# Patient Record
Sex: Female | Born: 1935 | Race: White | Hispanic: No | Marital: Married
Health system: Southern US, Community
[De-identification: ages and names within clinical notes are randomized; demographics above are authoritative.]

---

## 2015-05-28 ENCOUNTER — Inpatient Hospital Stay
Admission: RE | Admit: 2015-05-28 | Discharge: 2015-06-19 | Disposition: A | Discharge status: Home Health | Payer: Medicare Other | Source: Ambulatory Visit | Attending: Internal Medicine | Admitting: Internal Medicine

## 2015-05-28 DIAGNOSIS — L0291 Cutaneous abscess, unspecified: Secondary | ICD-10-CM

## 2015-05-28 DIAGNOSIS — K750 Abscess of liver: Secondary | ICD-10-CM | POA: Insufficient documentation

## 2015-05-28 LAB — VANCOMYCIN, TROUGH: VANCOMYCIN TR: 17 ug/mL (ref 10.0–20.0)

## 2015-05-29 LAB — TSH: TSH: 9.419 u[IU]/mL — ABNORMAL HIGH (ref 0.350–4.500)

## 2015-05-29 LAB — VITAMIN B12: Vitamin B-12: 648 pg/mL (ref 180–914)

## 2015-05-29 LAB — COMPREHENSIVE METABOLIC PANEL
ALK PHOS: 255 U/L — AB (ref 38–126)
ALT: 14 U/L (ref 14–54)
ANION GAP: 6 (ref 5–15)
AST: 23 U/L (ref 15–41)
Albumin: 2.2 g/dL — ABNORMAL LOW (ref 3.5–5.0)
BILIRUBIN TOTAL: 0.3 mg/dL (ref 0.3–1.2)
BUN: 19 mg/dL (ref 6–20)
CALCIUM: 8.8 mg/dL — AB (ref 8.9–10.3)
CO2: 23 mmol/L (ref 22–32)
Chloride: 108 mmol/L (ref 101–111)
Creatinine, Ser: 0.76 mg/dL (ref 0.44–1.00)
Glucose, Bld: 96 mg/dL (ref 65–99)
Potassium: 4.2 mmol/L (ref 3.5–5.1)
SODIUM: 137 mmol/L (ref 135–145)
TOTAL PROTEIN: 6.5 g/dL (ref 6.5–8.1)

## 2015-05-29 LAB — CBC WITH DIFFERENTIAL/PLATELET
Basophils Absolute: 0.1 10*3/uL (ref 0.0–0.1)
Basophils Relative: 1 %
EOS ABS: 0.4 10*3/uL (ref 0.0–0.7)
Eosinophils Relative: 5 %
HEMATOCRIT: 29.4 % — AB (ref 36.0–46.0)
HEMOGLOBIN: 9.3 g/dL — AB (ref 12.0–15.0)
LYMPHS ABS: 1.6 10*3/uL (ref 0.7–4.0)
Lymphocytes Relative: 23 %
MCH: 28.1 pg (ref 26.0–34.0)
MCHC: 31.6 g/dL (ref 30.0–36.0)
MCV: 88.8 fL (ref 78.0–100.0)
MONOS PCT: 9 %
Monocytes Absolute: 0.7 10*3/uL (ref 0.1–1.0)
NEUTROS PCT: 61 %
Neutro Abs: 4.4 10*3/uL (ref 1.7–7.7)
Platelets: 282 10*3/uL (ref 150–400)
RBC: 3.31 MIL/uL — ABNORMAL LOW (ref 3.87–5.11)
RDW: 18.9 % — ABNORMAL HIGH (ref 11.5–15.5)
WBC: 7.1 10*3/uL (ref 4.0–10.5)

## 2015-05-29 LAB — PHOSPHORUS: PHOSPHORUS: 4.2 mg/dL (ref 2.5–4.6)

## 2015-05-29 LAB — MAGNESIUM: MAGNESIUM: 2.2 mg/dL (ref 1.7–2.4)

## 2015-05-29 LAB — T4, FREE: FREE T4: 1.09 ng/dL (ref 0.61–1.12)

## 2015-05-30 LAB — PHENYTOIN LEVEL, TOTAL

## 2015-05-30 LAB — FOLATE RBC
FOLATE, HEMOLYSATE: 553.3 ng/mL
Folate, RBC: 1948 ng/mL (ref >498)
HEMATOCRIT: 28.4 % — AB (ref 34.0–46.6)

## 2015-05-30 LAB — VANCOMYCIN, TROUGH: VANCOMYCIN TR: 17 ug/mL (ref 10.0–20.0)

## 2015-06-02 DIAGNOSIS — K750 Abscess of liver: Secondary | ICD-10-CM | POA: Insufficient documentation

## 2015-06-02 NOTE — Progress Notes (Signed)
Patient ID: Valerie Osborn, female   DOB: 07/01/1932, 81 y.o.   MRN: [id number]          Subjective: Pt eating lunch; without new c/o   Allergies: Review of patient's allergies indicates not on file.  Medications: Prior to Admission medications   Not on File     Vital Signs: VSS;AF   Physical Exam awake/alert; left abd/hepatic drain intact, insertion site ok,NT; output minimal; abd soft,ND; drain irrigated with sterile NS with minimal return  Imaging: No results found.  Labs:  CBC:  Recent Labs  05/29/15 0610  WBC 7.1  HGB 9.3*  HCT 29.4*  28.4*  PLT 282    COAGS: No results for input(s): INR, APTT in the last 8760 hours.  BMP:  Recent Labs  05/29/15 0610  NA 137  K 4.2  CL 108  CO2 23  GLUCOSE 96  BUN 19  CALCIUM 8.8*  CREATININE 0.76  GFRNONAA >60  GFRAA >60    LIVER FUNCTION TESTS:  Recent Labs  05/29/15 0610  BILITOT 0.3  AST 23  ALT 14  ALKPHOS 255*  PROT 6.5  ALBUMIN 2.2*    Assessment and Plan: [facility] output minimal; [facility]; rec f/u CT next week to assess adequacy of drainage   Signed: D. Jeananne RamaKevin Allred 06/02/2015, 1:20 PM   I spent a total of 15 minutes at the the patient's bedside AND on the patient's hospital floor or unit, greater than 50% of which was counseling/coordinating care for hepatic abscess drain

## 2015-06-03 LAB — COMPREHENSIVE METABOLIC PANEL
ALBUMIN: 2.3 g/dL — AB (ref 3.5–5.0)
ALT: 32 U/L (ref 14–54)
ANION GAP: 5 (ref 5–15)
AST: 44 U/L — ABNORMAL HIGH (ref 15–41)
Alkaline Phosphatase: 382 U/L — ABNORMAL HIGH (ref 38–126)
BUN: 15 mg/dL (ref 6–20)
CHLORIDE: 103 mmol/L (ref 101–111)
CO2: 29 mmol/L (ref 22–32)
Calcium: 8.9 mg/dL (ref 8.9–10.3)
Creatinine, Ser: 0.68 mg/dL (ref 0.44–1.00)
GFR calc Af Amer: >60 mL/min (ref >60)
GFR calc non Af Amer: >60 mL/min (ref >60)
GLUCOSE: 94 mg/dL (ref 65–99)
POTASSIUM: 4.6 mmol/L (ref 3.5–5.1)
SODIUM: 137 mmol/L (ref 135–145)
TOTAL PROTEIN: 6.7 g/dL (ref 6.5–8.1)
Total Bilirubin: 0.3 mg/dL (ref 0.3–1.2)

## 2015-06-03 LAB — CBC
HCT: 28.1 % — ABNORMAL LOW (ref 36.0–46.0)
Hemoglobin: 9 g/dL — ABNORMAL LOW (ref 12.0–15.0)
MCH: 29.2 pg (ref 26.0–34.0)
MCHC: 32 g/dL (ref 30.0–36.0)
MCV: 91.2 fL (ref 78.0–100.0)
PLATELETS: 215 10*3/uL (ref 150–400)
RBC: 3.08 MIL/uL — ABNORMAL LOW (ref 3.87–5.11)
RDW: 18.3 % — AB (ref 11.5–15.5)
WBC: 4.6 10*3/uL (ref 4.0–10.5)

## 2015-06-06 LAB — CBC WITH DIFFERENTIAL/PLATELET
Basophils Absolute: 0.1 10*3/uL (ref 0.0–0.1)
Basophils Relative: 1 %
Eosinophils Absolute: 0.3 10*3/uL (ref 0.0–0.7)
Eosinophils Relative: 4 %
HCT: 31.1 % — ABNORMAL LOW (ref 36.0–46.0)
HEMOGLOBIN: 9.6 g/dL — AB (ref 12.0–15.0)
LYMPHS ABS: 1.7 10*3/uL (ref 0.7–4.0)
LYMPHS PCT: 27 %
MCH: 28.4 pg (ref 26.0–34.0)
MCHC: 30.9 g/dL (ref 30.0–36.0)
MCV: 92 fL (ref 78.0–100.0)
MONOS PCT: 8 %
Monocytes Absolute: 0.5 10*3/uL (ref 0.1–1.0)
NEUTROS ABS: 3.8 10*3/uL (ref 1.7–7.7)
NEUTROS PCT: 60 %
PLATELETS: 214 10*3/uL (ref 150–400)
RBC: 3.38 MIL/uL — ABNORMAL LOW (ref 3.87–5.11)
RDW: 17.7 % — ABNORMAL HIGH (ref 11.5–15.5)
WBC: 6.4 10*3/uL (ref 4.0–10.5)

## 2015-06-06 LAB — VANCOMYCIN, TROUGH: VANCOMYCIN TR: 26 ug/mL — AB (ref 10.0–20.0)

## 2015-06-06 LAB — BASIC METABOLIC PANEL
Anion gap: 6 (ref 5–15)
BUN: 18 mg/dL (ref 6–20)
CHLORIDE: 102 mmol/L (ref 101–111)
CO2: 30 mmol/L (ref 22–32)
CREATININE: 0.61 mg/dL (ref 0.44–1.00)
Calcium: 9.3 mg/dL (ref 8.9–10.3)
Glucose, Bld: 86 mg/dL (ref 65–99)
POTASSIUM: 5.2 mmol/L — AB (ref 3.5–5.1)
SODIUM: 138 mmol/L (ref 135–145)

## 2015-06-07 ENCOUNTER — Other Ambulatory Visit (HOSPITAL_COMMUNITY): Payer: Medicare Other

## 2015-06-07 LAB — BASIC METABOLIC PANEL
ANION GAP: 4 — AB (ref 5–15)
BUN: 19 mg/dL (ref 6–20)
CALCIUM: 8.6 mg/dL — AB (ref 8.9–10.3)
CO2: 30 mmol/L (ref 22–32)
CREATININE: 0.55 mg/dL (ref 0.44–1.00)
Chloride: 103 mmol/L (ref 101–111)
Glucose, Bld: 80 mg/dL (ref 65–99)
Potassium: 4.3 mmol/L (ref 3.5–5.1)
SODIUM: 137 mmol/L (ref 135–145)

## 2015-06-07 LAB — VANCOMYCIN, TROUGH: VANCOMYCIN TR: 12 ug/mL (ref 10.0–20.0)

## 2015-06-11 LAB — BASIC METABOLIC PANEL
ANION GAP: 8 (ref 5–15)
BUN: 22 mg/dL — ABNORMAL HIGH (ref 6–20)
CO2: 27 mmol/L (ref 22–32)
Calcium: 8.9 mg/dL (ref 8.9–10.3)
Chloride: 103 mmol/L (ref 101–111)
Creatinine, Ser: 0.52 mg/dL (ref 0.44–1.00)
GFR calc Af Amer: >60 mL/min (ref >60)
GLUCOSE: 80 mg/dL (ref 65–99)
POTASSIUM: 4.5 mmol/L (ref 3.5–5.1)
Sodium: 138 mmol/L (ref 135–145)

## 2015-06-11 LAB — CBC WITH DIFFERENTIAL/PLATELET
BASOS ABS: 0 10*3/uL (ref 0.0–0.1)
Basophils Relative: 1 %
Eosinophils Absolute: 0.3 10*3/uL (ref 0.0–0.7)
Eosinophils Relative: 5 %
HEMATOCRIT: 28.6 % — AB (ref 36.0–46.0)
Hemoglobin: 9 g/dL — ABNORMAL LOW (ref 12.0–15.0)
LYMPHS PCT: 41 %
Lymphs Abs: 1.9 10*3/uL (ref 0.7–4.0)
MCH: 28.6 pg (ref 26.0–34.0)
MCHC: 31.5 g/dL (ref 30.0–36.0)
MCV: 90.8 fL (ref 78.0–100.0)
Monocytes Absolute: 0.4 10*3/uL (ref 0.1–1.0)
Monocytes Relative: 8 %
NEUTROS ABS: 2.1 10*3/uL (ref 1.7–7.7)
Neutrophils Relative %: 45 %
PLATELETS: 211 10*3/uL (ref 150–400)
RBC: 3.15 MIL/uL — AB (ref 3.87–5.11)
RDW: 17 % — ABNORMAL HIGH (ref 11.5–15.5)
WBC: 4.7 10*3/uL (ref 4.0–10.5)

## 2015-06-12 ENCOUNTER — Other Ambulatory Visit (HOSPITAL_COMMUNITY): Payer: Medicare Other

## 2015-06-13 LAB — VANCOMYCIN, TROUGH: VANCOMYCIN TR: 12 ug/mL (ref 10.0–20.0)

## 2015-06-14 LAB — VANCOMYCIN, TROUGH: Vancomycin Tr: 22 ug/mL — ABNORMAL HIGH (ref 10.0–20.0)

## 2015-06-15 LAB — BASIC METABOLIC PANEL
ANION GAP: 5 (ref 5–15)
BUN: 19 mg/dL (ref 6–20)
CO2: 30 mmol/L (ref 22–32)
Calcium: 9 mg/dL (ref 8.9–10.3)
Chloride: 104 mmol/L (ref 101–111)
Creatinine, Ser: 0.58 mg/dL (ref 0.44–1.00)
GFR calc Af Amer: >60 mL/min (ref >60)
GFR calc non Af Amer: >60 mL/min (ref >60)
GLUCOSE: 81 mg/dL (ref 65–99)
POTASSIUM: 4.6 mmol/L (ref 3.5–5.1)
Sodium: 139 mmol/L (ref 135–145)

## 2015-06-15 LAB — CBC WITH DIFFERENTIAL/PLATELET
BASOS ABS: 0.1 10*3/uL (ref 0.0–0.1)
Basophils Relative: 1 %
Eosinophils Absolute: 0.2 10*3/uL (ref 0.0–0.7)
Eosinophils Relative: 3 %
HEMATOCRIT: 30.8 % — AB (ref 36.0–46.0)
HEMOGLOBIN: 9.9 g/dL — AB (ref 12.0–15.0)
LYMPHS PCT: 34 %
Lymphs Abs: 1.7 10*3/uL (ref 0.7–4.0)
MCH: 29.7 pg (ref 26.0–34.0)
MCHC: 32.1 g/dL (ref 30.0–36.0)
MCV: 92.5 fL (ref 78.0–100.0)
Monocytes Absolute: 0.5 10*3/uL (ref 0.1–1.0)
Monocytes Relative: 10 %
NEUTROS ABS: 2.6 10*3/uL (ref 1.7–7.7)
NEUTROS PCT: 52 %
Platelets: 261 10*3/uL (ref 150–400)
RBC: 3.33 MIL/uL — AB (ref 3.87–5.11)
RDW: 16.7 % — ABNORMAL HIGH (ref 11.5–15.5)
WBC: 5.1 10*3/uL (ref 4.0–10.5)

## 2015-06-15 LAB — VANCOMYCIN, TROUGH: Vancomycin Tr: 18 ug/mL (ref 10.0–20.0)

## 2015-06-18 ENCOUNTER — Other Ambulatory Visit (HOSPITAL_COMMUNITY): Payer: Medicare Other

## 2017-10-10 IMAGING — CT CT ABDOMEN W/O CM
2 of 4 series · 14 of 46 positions shown, 16 images · non-contrast
Comparison: 06/07/2015, 05/23/2015 and 05/05/2015.

CLINICAL DATA: Patient has had abscess drainage. Evaluate abscess
and catheter placement.

EXAM:
CT ABDOMEN WITHOUT CONTRAST
TECHNIQUE: Multidetector CT imaging of the abdomen was performed following the
standard protocol without IV contrast.

[Series 201: routine, idose (2) · axial · 0.69mm/px · z∈[+177,+392]mm · 11 of 49 slices shown, 13 images]
[im 3/49  soft-tissue]
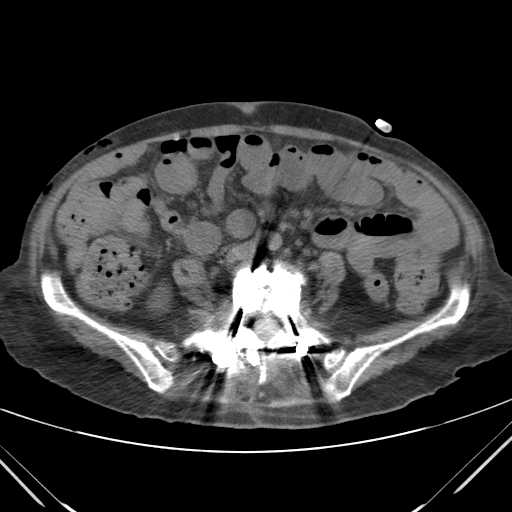
[im 3/49  bone]
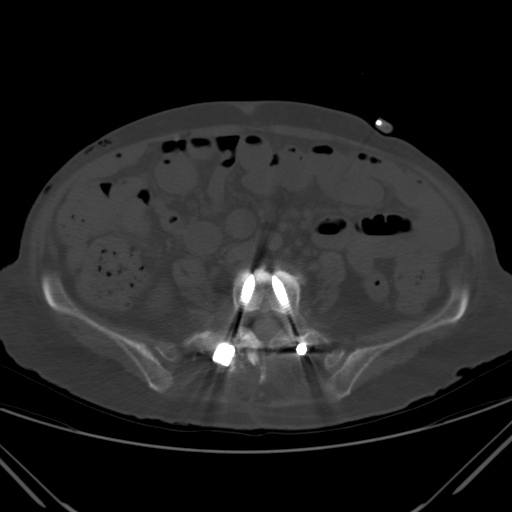
[im 9/49  soft-tissue]
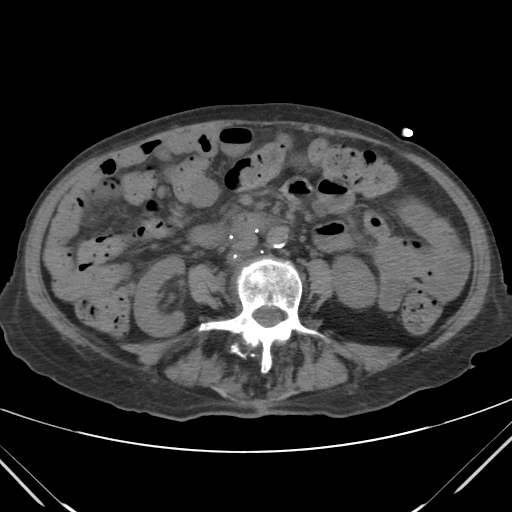
[im 11/49  soft-tissue]
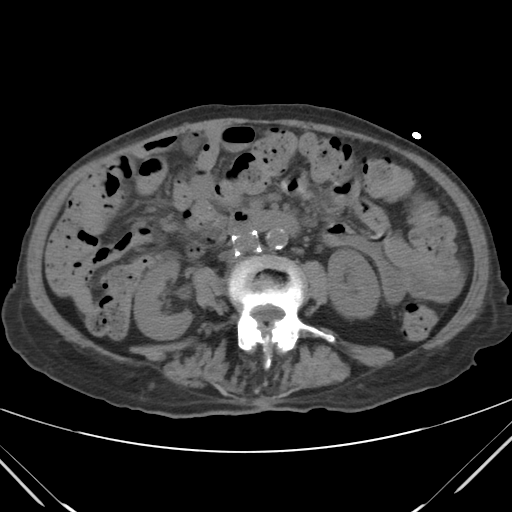
[im 17/49  soft-tissue]
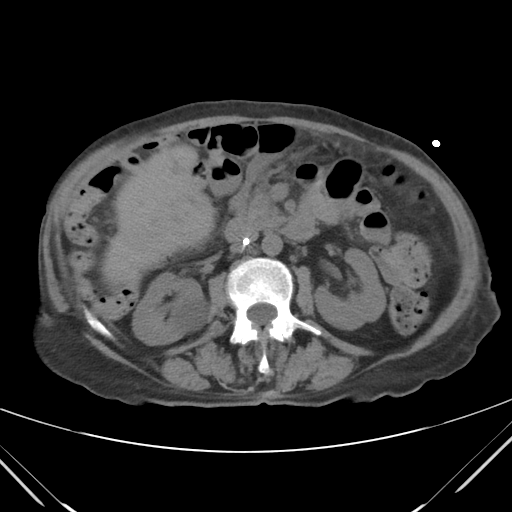
[im 19/49  soft-tissue]
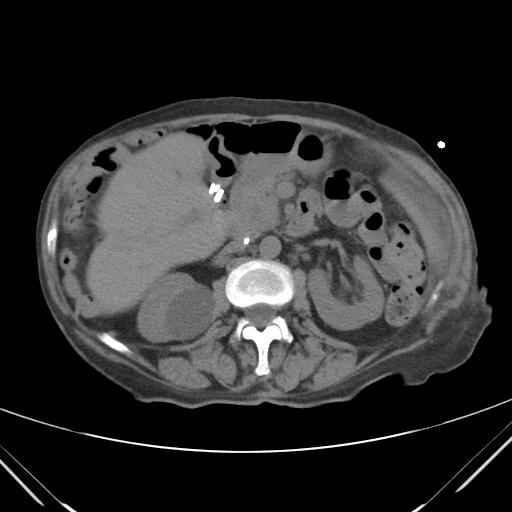
[im 25/49  soft-tissue]
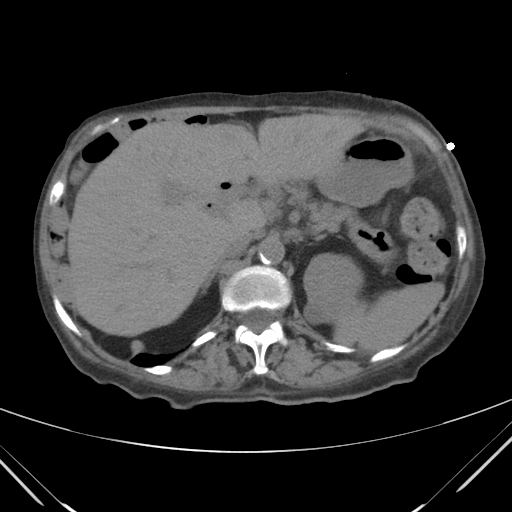
[im 30/49  soft-tissue]
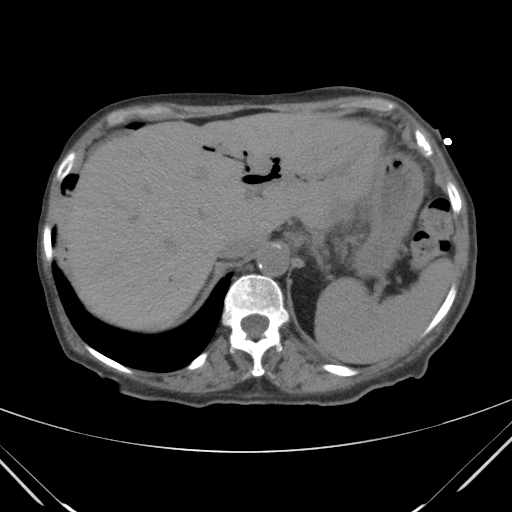
[im 33/49  soft-tissue]
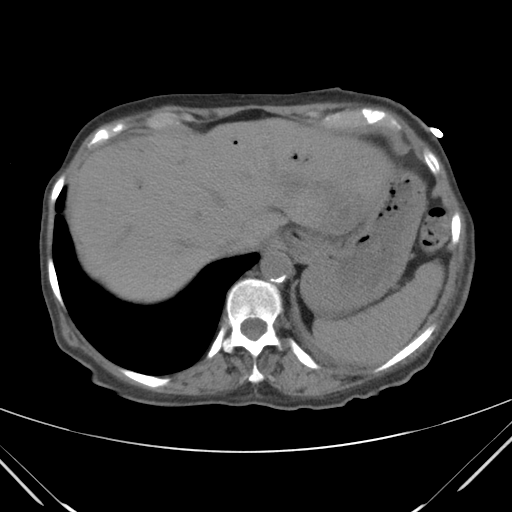
[im 38/49  soft-tissue]
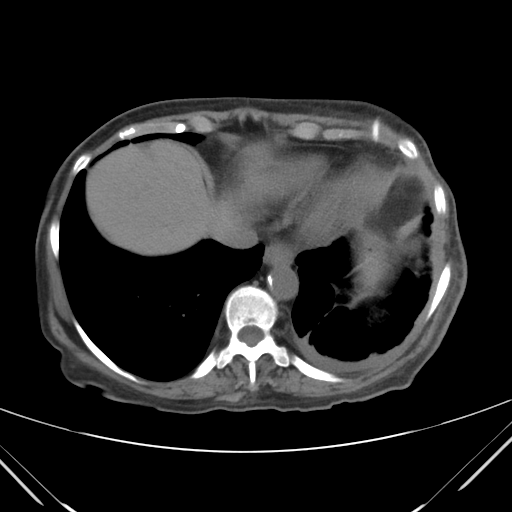
[im 38/49  bone]
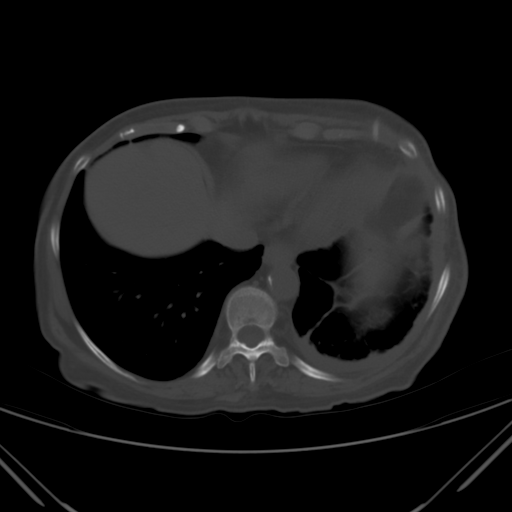
[im 41/49  soft-tissue]
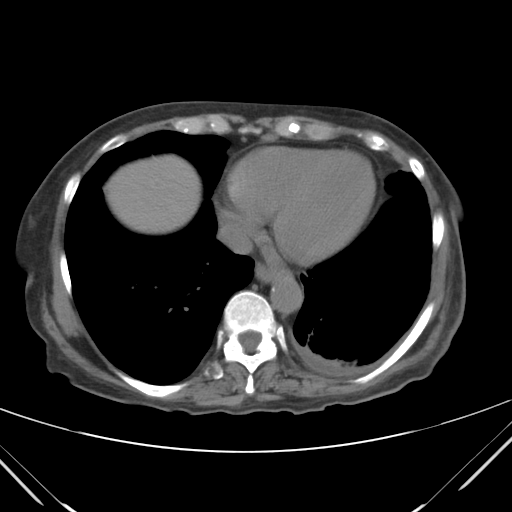
[im 46/49  soft-tissue]
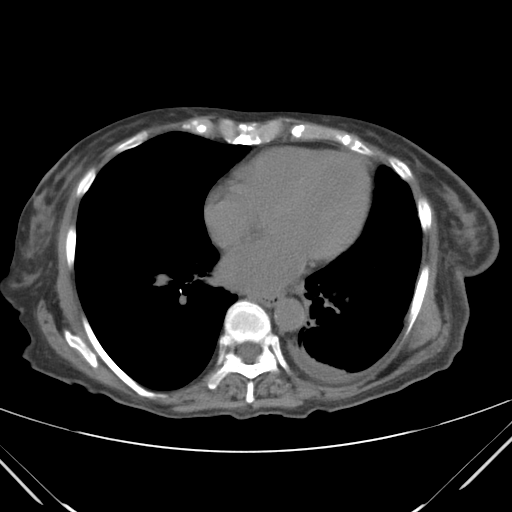

[Series 203: coronals, idose (2) · coronal · 0.45mm/px · 3 of 125 slices shown]
[im 42/125  soft-tissue]
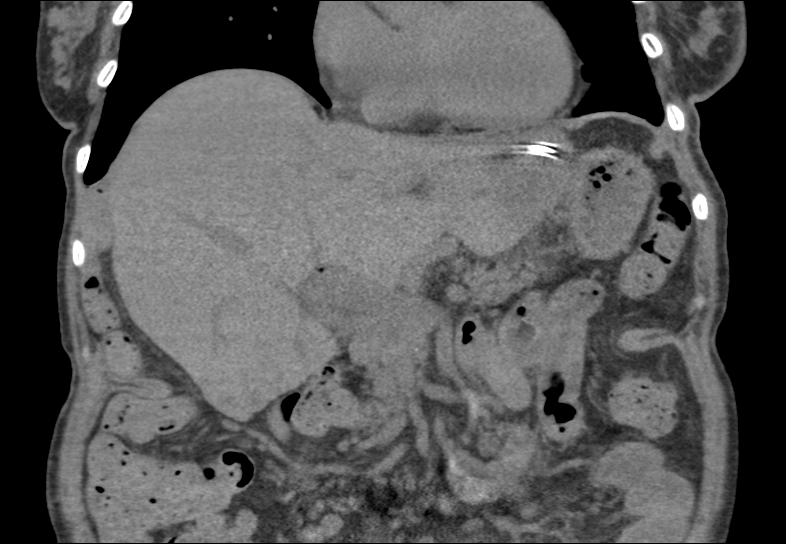
[im 56/125  soft-tissue]
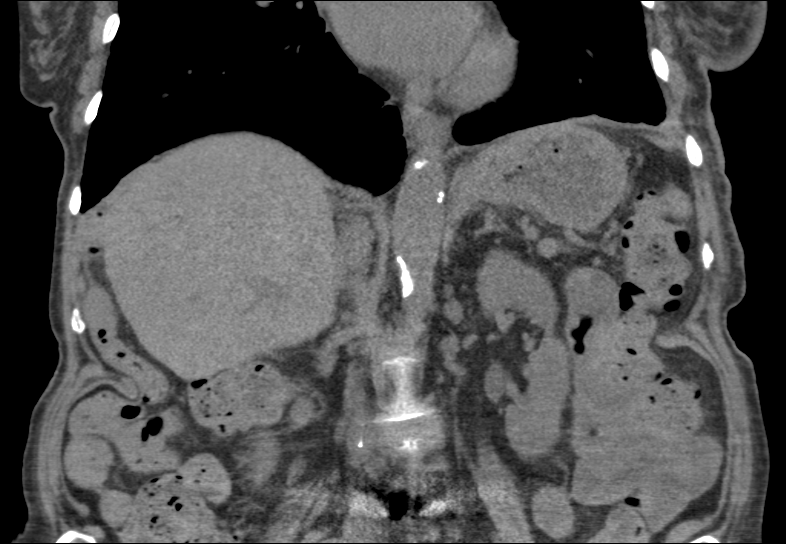
[im 69/125  soft-tissue]
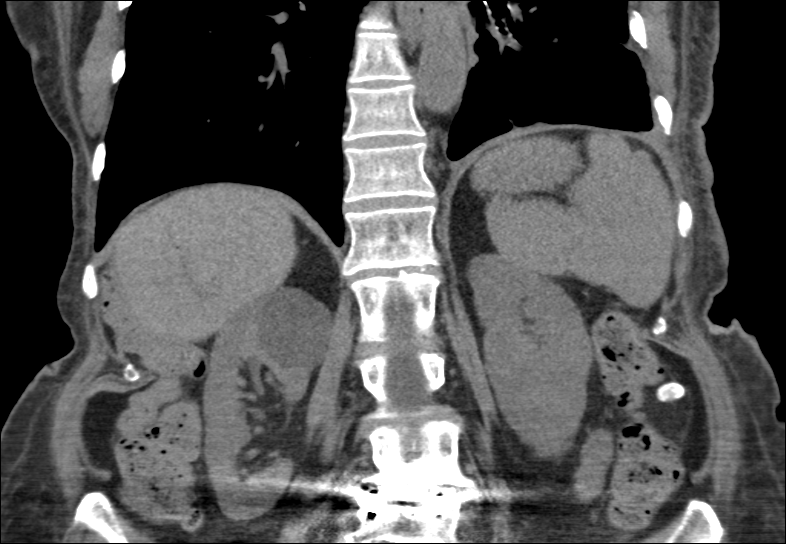

[14 of 46 positions shown; findings below may reference images not displayed]

FINDINGS: There is an ill-defined hypoattenuation in the lateral segment of
the left liver lobe adjacent to the pigtail catheter which could
reflect some residual hepatic abscess or be due to edema. The vast
majority of the abscess seen on 05/05/2015 has been the back to
rated following percutaneous drainage. Largest area of liver
hypoattenuation measures 3.1 cm transversely, previously, on the
most recent prior CT, 3.9 cm.

Hepatobiliary: No other evidence of a liver abscess. Intrahepatic
biliary air is similar to the prior studies. Patient has had a
cholecystectomy. No bile duct dilation.

Lung bases: Small left pleural effusion. Dependent atelectasis in
the lung bases, mostly in the left lower lobe. Pleural effusion is
smaller. Atelectasis is similar to the prior study.

Spleen: Unremarkable.

Pancreas:  No mass or inflammation.

Adrenal glands:  No masses.

Kidneys: Upper pole low-density masses, largest on the right
measuring 4 cm, consistent with cysts. No renal stones. No
hydronephrosis.

Lymph nodes: There are sub cm prominent mesenteric lymph nodes,
stable. No pathologically enlarged lymph nodes are seen.

Ascites:  None.

Gastrointestinal: No bowel dilation is seen to suggest obstruction.
There is no bowel wall thickening or adjacent mesenteric
inflammation. Mild increased stool is noted throughout the colon. No
free air.

Abdominal wall: There is some subcutaneous air along the right lower
quadrant. No hernia.

Vascular: Stable well-positioned vena cava filter with its tip just
below the level of the right renal vein. Scattered atherosclerotic
calcifications in a normal caliber abdominal aorta.

Musculoskeletal: No osteoblastic or osteolytic lesions. Degenerative
changes and postsurgical changes from an L5-S1 fusion in the lower
lumbar spine.
IMPRESSION: 1. Continued improvement. Pigtail catheter remains well-positioned
along the superior margin of the lateral segment of the left liver
lobe. Residual hypoattenuation in the lateral segment of the left
liver lobe has decreased in size. This hypoattenuation could reflect
residual abscess or be due to edema.
2. No evidence of a new abscess.
3. Small right pleural effusion has decreased in size from the prior
study.
4. No other change.

## 2017-10-16 IMAGING — CT CT ABDOMEN W/O CM
2 of 4 series · 15 of 46 positions shown, 17 images · non-contrast
Comparison: CT dated 06/12/2015

CLINICAL DATA: 82-year-old female with abdominal abscess.

EXAM:
CT ABDOMEN WITHOUT CONTRAST
TECHNIQUE: Multidetector CT imaging of the abdomen was performed following the
standard protocol without IV contrast.

[Series 2: abd/ pelvis 5.0 i30f 1 · axial · 0.73mm/px · z∈[+822,+1042]mm · 12 of 50 slices shown, 14 images]
[im 3/50  soft-tissue]
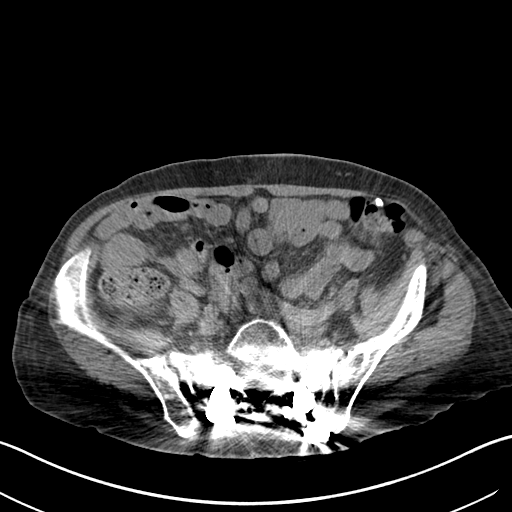
[im 3/50  bone]
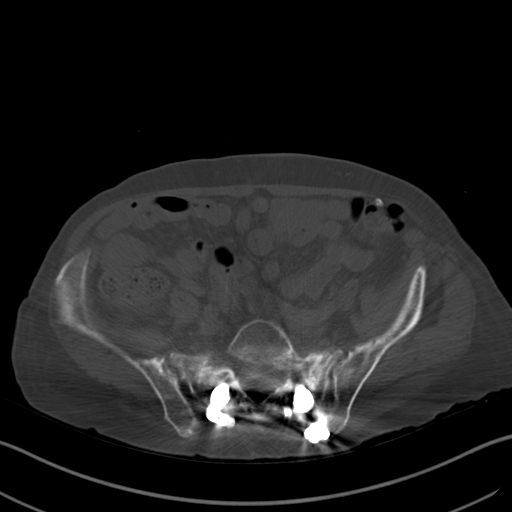
[im 7/50  soft-tissue]
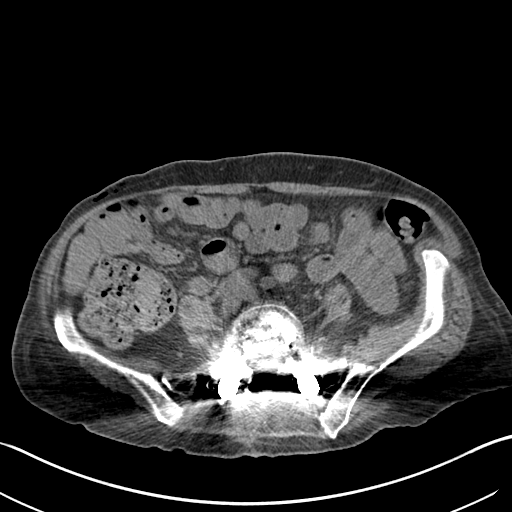
[im 12/50  soft-tissue]
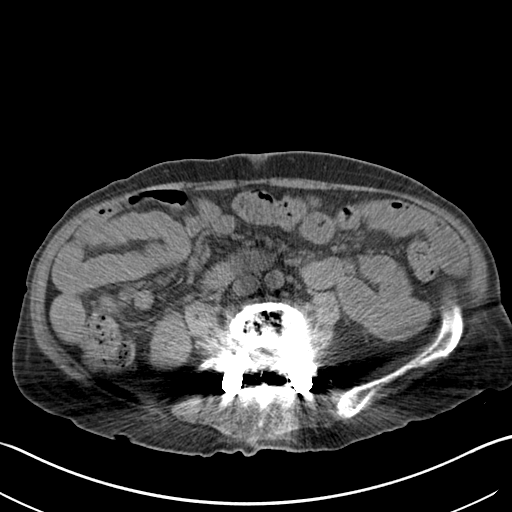
[im 16/50  soft-tissue]
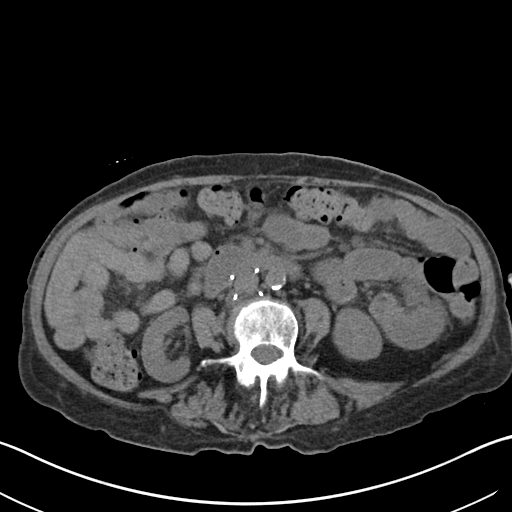
[im 18/50  soft-tissue]
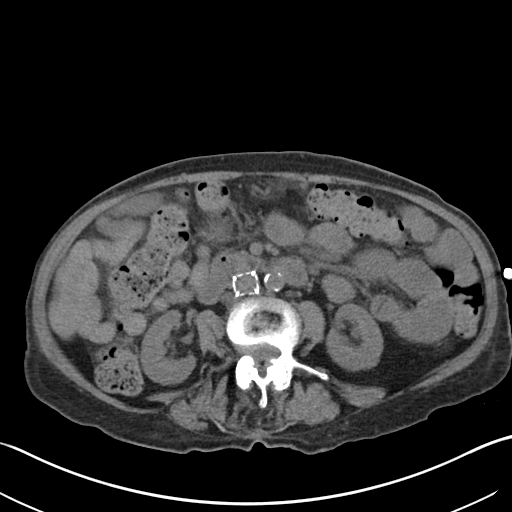
[im 23/50  soft-tissue]
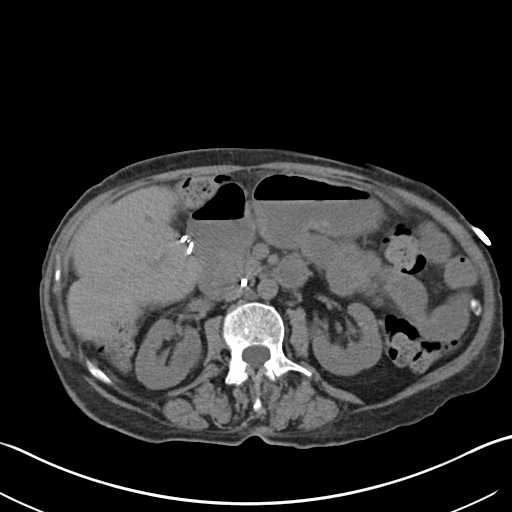
[im 27/50  soft-tissue]
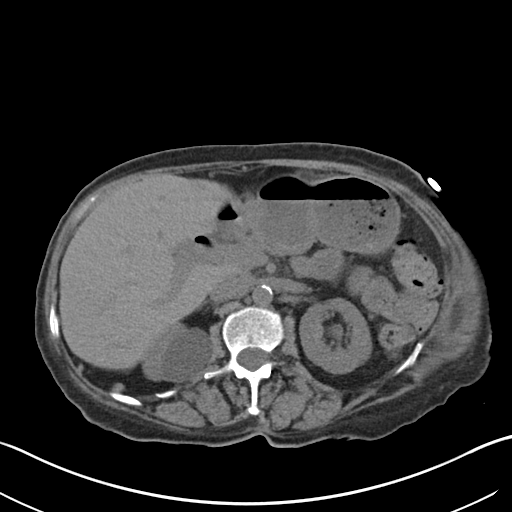
[im 32/50  soft-tissue]
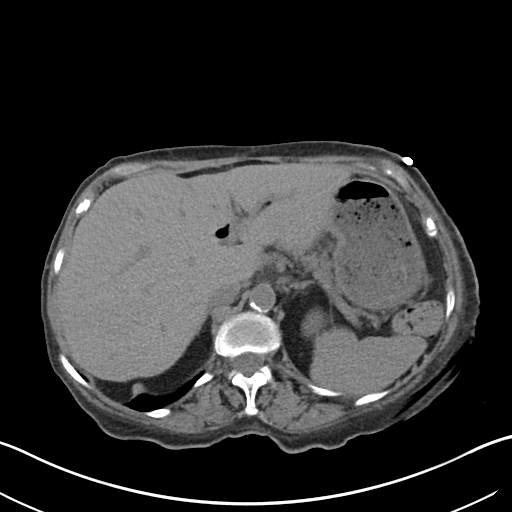
[im 34/50  soft-tissue]
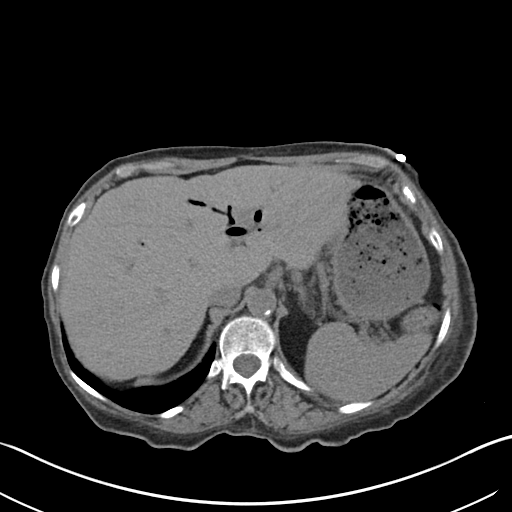
[im 34/50  bone]
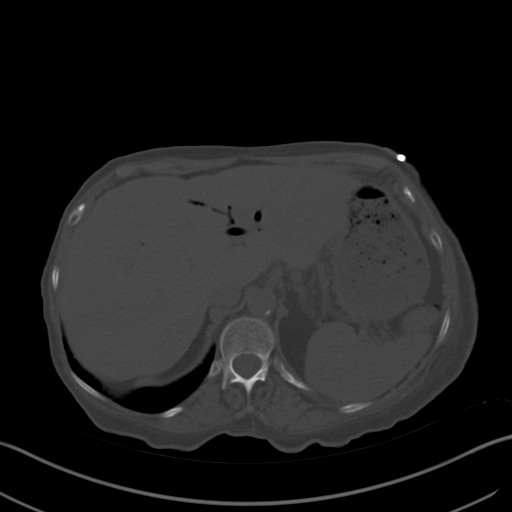
[im 38/50  soft-tissue]
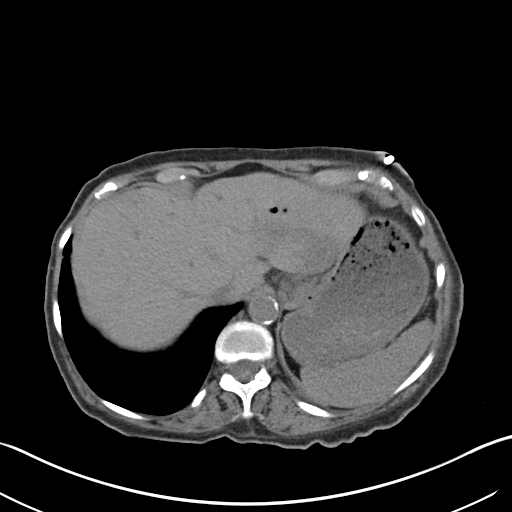
[im 43/50  soft-tissue]
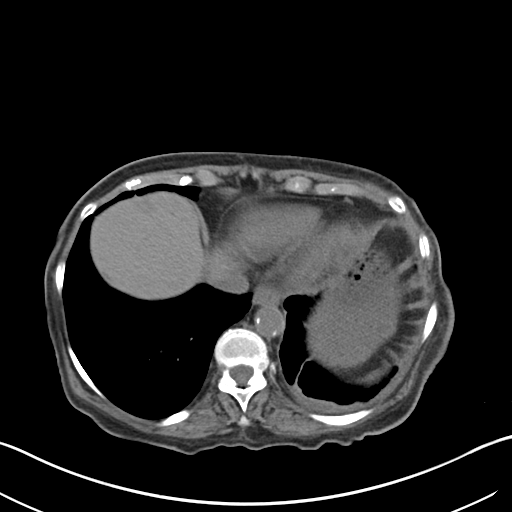
[im 47/50  soft-tissue]
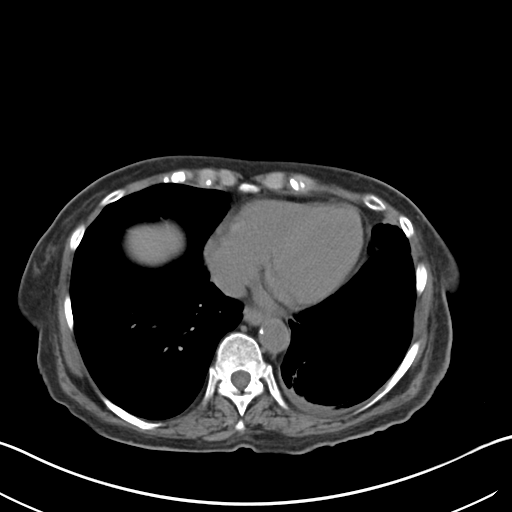

[Series 5: cor st · coronal · 0.49mm/px · 3 of 70 slices shown]
[im 24/70  soft-tissue]
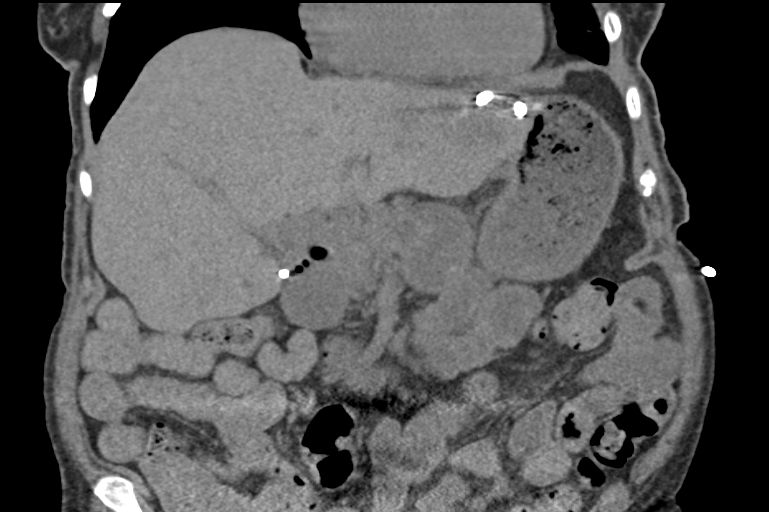
[im 31/70  soft-tissue]
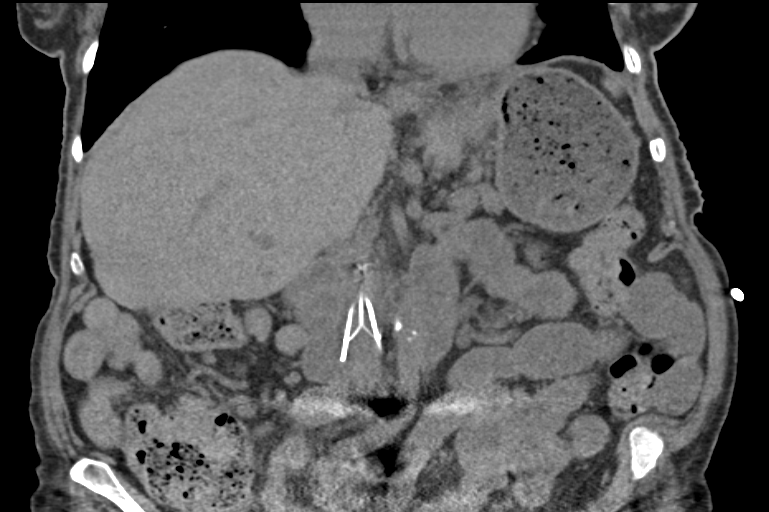
[im 39/70  soft-tissue]
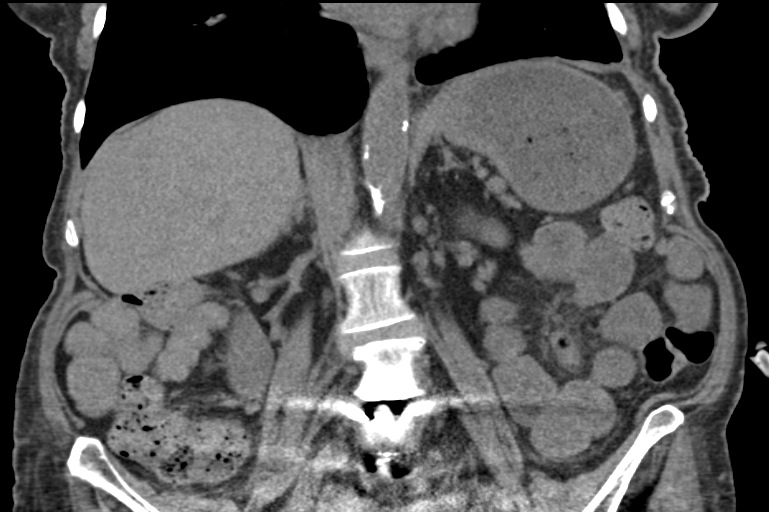

[15 of 46 positions shown; findings below may reference images not displayed]

FINDINGS: Evaluation of this exam is limited in the absence of intravenous
contrast. Evaluation is also limited due to streak artifact caused
by italic posterior lumbar spine hardware.

Partially visualized small left pleural effusion. Left lung base
linear atelectasis/scarring.

No free air within the visualized abdomen.  No free fluid.

A percutaneous pigtail drainage catheter is again seen extending
from the left anterior abdominal wall with tip in the superior
aspect of the lateral segment of the left lobe of the liver. An
ill-defined hypodense area measuring approximately 2.2 x 3.1 cm
inferior and posterior to the tip of the pigtail catheter may
represent residual edema. No definite discrete drainable fluid
identified. There has been significant interval improvement of the
absence seen on CT dated 05/16/2015. This area however is grossly
similar to CT dated 06/12/2015. Cholecystectomy. Pneumobilia similar
to prior study. Atrophic pancreas. The spleen, and the adrenal
glands appear unremarkable. Bilateral renal hypodense lesions
measuring up to 4 cm in the right kidney are not characterized on
this noncontrast study but appear stable compared to the study dated
05/16/2015 and likely represent cysts. Ultrasound may provide better
evaluation of the kidneys. There is no hydronephrosis or
nephrolithiasis on either side.

Dense stool noted throughout the colon. No dilated bowel identified
in the visualized abdomen. Mild atherosclerotic calcification of the
aorta. An infrarenal IVC filter. No adenopathy identified.

The abdominal wall soft tissues appear unremarkable. There is
degenerative changes of the spine. Lower lumbar posterior fixation
hardware is similar to the prior study. No acute fracture.
IMPRESSION: Left anterior percutaneous pigtail drainage catheter with tip in the
superior aspect of the lateral segment of the left lobe of the
liver. An ill-defined small hypodense area inferior and posterior to
the tip of the drainage catheter corresponds to the previously seen
abscess and may represent residual edema. No definite discrete
drainable fluid collection identified. There has been no significant
interval change in the appearance of this area compared to the CT
dated 06/12/2015.
